# Patient Record
Sex: Male | Born: 1970 | Race: Black or African American | Hispanic: No | Marital: Single | Smoking: Current every day smoker
Health system: Southern US, Community
[De-identification: ages and names within clinical notes are randomized; demographics above are authoritative.]

---

## 2005-05-21 ENCOUNTER — Emergency Department (HOSPITAL_COMMUNITY): Admission: EM | Admit: 2005-05-21 | Discharge: 2005-05-22 | Payer: Self-pay | Admitting: Emergency Medicine

## 2014-05-06 ENCOUNTER — Ambulatory Visit (INDEPENDENT_AMBULATORY_CARE_PROVIDER_SITE_OTHER): Payer: Self-pay

## 2014-05-06 ENCOUNTER — Ambulatory Visit (INDEPENDENT_AMBULATORY_CARE_PROVIDER_SITE_OTHER): Payer: Self-pay | Admitting: Family Medicine

## 2014-05-06 VITALS — BP 102/68 | HR 71 | Temp 97.9°F | Resp 16 | Ht 71.5 in | Wt 168.0 lb

## 2014-05-06 DIAGNOSIS — R0789 Other chest pain: Secondary | ICD-10-CM

## 2014-05-06 DIAGNOSIS — R059 Cough, unspecified: Secondary | ICD-10-CM

## 2014-05-06 DIAGNOSIS — Z720 Tobacco use: Secondary | ICD-10-CM

## 2014-05-06 DIAGNOSIS — R05 Cough: Secondary | ICD-10-CM

## 2014-05-06 MED ORDER — AZITHROMYCIN 250 MG PO TABS: ORAL_TABLET | ORAL | Status: AC

## 2014-05-06 NOTE — Patient Instructions (Signed)
Use the azithromycin (antibiotic) as directed for possible chest infection/ pneumonia Rest and drink plenty of fluids, use ibuprofen as needed If your symptoms do not resolve please come back and see me - see me sooner if you are getting worse!  Please do work on quitting smoking- quitting now will reduce your risk of lung cancer, COPD and many other problems

## 2014-05-06 NOTE — Progress Notes (Signed)
[facility] [address] [place] [address] [phone]  Date:  05/06/2014   Name:  Gregory Mccullough   DOB:  [birth year]   MRN:  [id number]  PCP:  No primary care provider on file.    Chief Complaint: Night Sweats; Fatigue; and Shortness of Breath   History of Present Illness:  Gregory Mccullough is a 44 y.o. very pleasant male patient who presents with the following:  He has noted SOB, "waking up in a sweat" in the am for the last several days.  He has felt SOB for years which he attributes to smoking, but it is worse over the last week or so. He will cough up "a lot of phlegm" in the am.  The phlegm is brown in color.   He has not noted a fever but has not checked with a thermometer.  He is otherwise generally healthy He smokes about 1ppd- has done so for about 25 years.  He is interested in quitting and plans to try and quit soon His weight is stable. He is able to eat ok, but over the last few days he has noted decreased appetite. He is taking dayquil and nyquil No history of heart problems He has not noted any exertional CP.  He will have the pain in his chest when he takes a deep breath or coughs  There are no active problems to display for this patient.   History reviewed. No pertinent past medical history.  History reviewed. No pertinent past surgical history.  History  Substance Use Topics  . Smoking status: Current Every Day Smoker -- 1.00 packs/day    Types: Cigarettes  . Smokeless tobacco: Not on file  . Alcohol Use: Not on file    History reviewed. No pertinent family history.  No Known Allergies  Medication list has been reviewed and updated.  No current outpatient prescriptions on file prior to visit.   No current facility-administered medications on file prior to visit.    Review of Systems:  As per HPI- otherwise negative.   Physical Examination: Filed Vitals:   05/06/14 1249  BP: 102/68  Pulse: 71  Temp: 97.9 F (36.6  C)  Resp: 16   Filed Vitals:   05/06/14 1249  Height: 5' 11.5" (1.816 m)  Weight: 168 lb (76.204 kg)   Body mass index is 23.11 kg/(m^2). Ideal Body Weight: Weight in (lb) to have BMI = 25: 181.4  GEN: WDWN, NAD, Non-toxic, A & O x 3, looks well HEENT: Atraumatic, Normocephalic. Neck supple. No masses, No LAD.  Bilateral TM wnl, oropharynx normal.  PEERL,EOMI.  Ears and Nose: No external deformity. CV: RRR, No M/G/R. No JVD. No thrill. No extra heart sounds. PULM: CTA B, no wheezes, crackles, rhonchi. No retractions. No resp. distress. No accessory muscle use. Able to reproduce right sided CP by pressing on his right chest wall.  ABD: S, NT, ND EXTR: No c/c/e NEURO Normal gait.  PSYCH: Normally interactive. Conversant. Not depressed or anxious appearing.  Calm demeanor.   [facility] reading (PRIMARY) by  Dr. Patsy Lager. CXR:  Possible mild right middle lobe infiltrate.    Assessment and Plan: Cough - Plan: DG Chest 2 View, azithromycin (ZITHROMAX) 250 MG tablet  Other chest pain - Plan: DG Chest 2 View, azithromycin (ZITHROMAX) 250 MG tablet  Here today with cough and questionable CXR.  Explained that night sweats can indicate something serious such as HIV or cancer, offered further evaluation/ labs today.  He  does not wish to look further today due to financial concerns but agreed to get in touch with me if he is not better soon.     Signed Abbe AmsterdamJessica Tracen Mahler, MD

## 2015-08-09 IMAGING — CR DG CHEST 2V
2 series · 2 of 2 positions shown · non-contrast
Comparison: None.

CLINICAL DATA: Cough.  Chest pain.  Night sweats.

EXAM:
CHEST  2 VIEW

[PA]
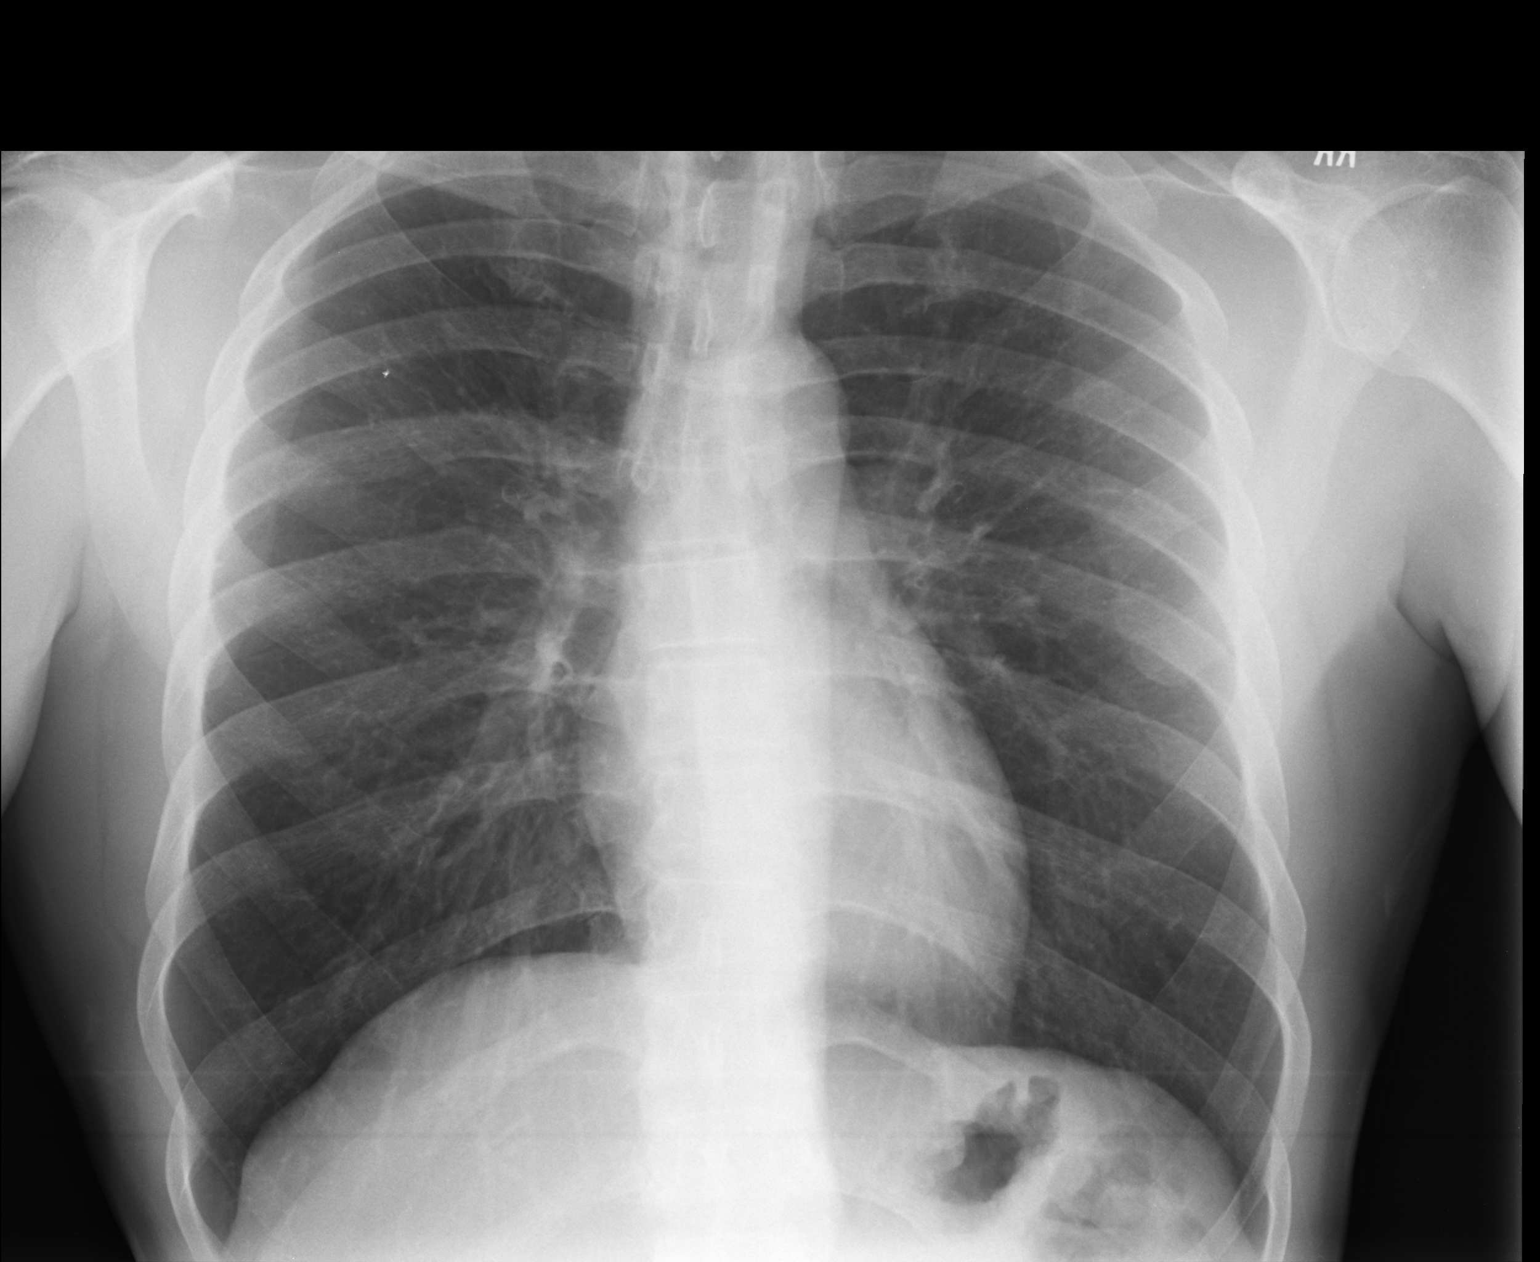

[lateral]
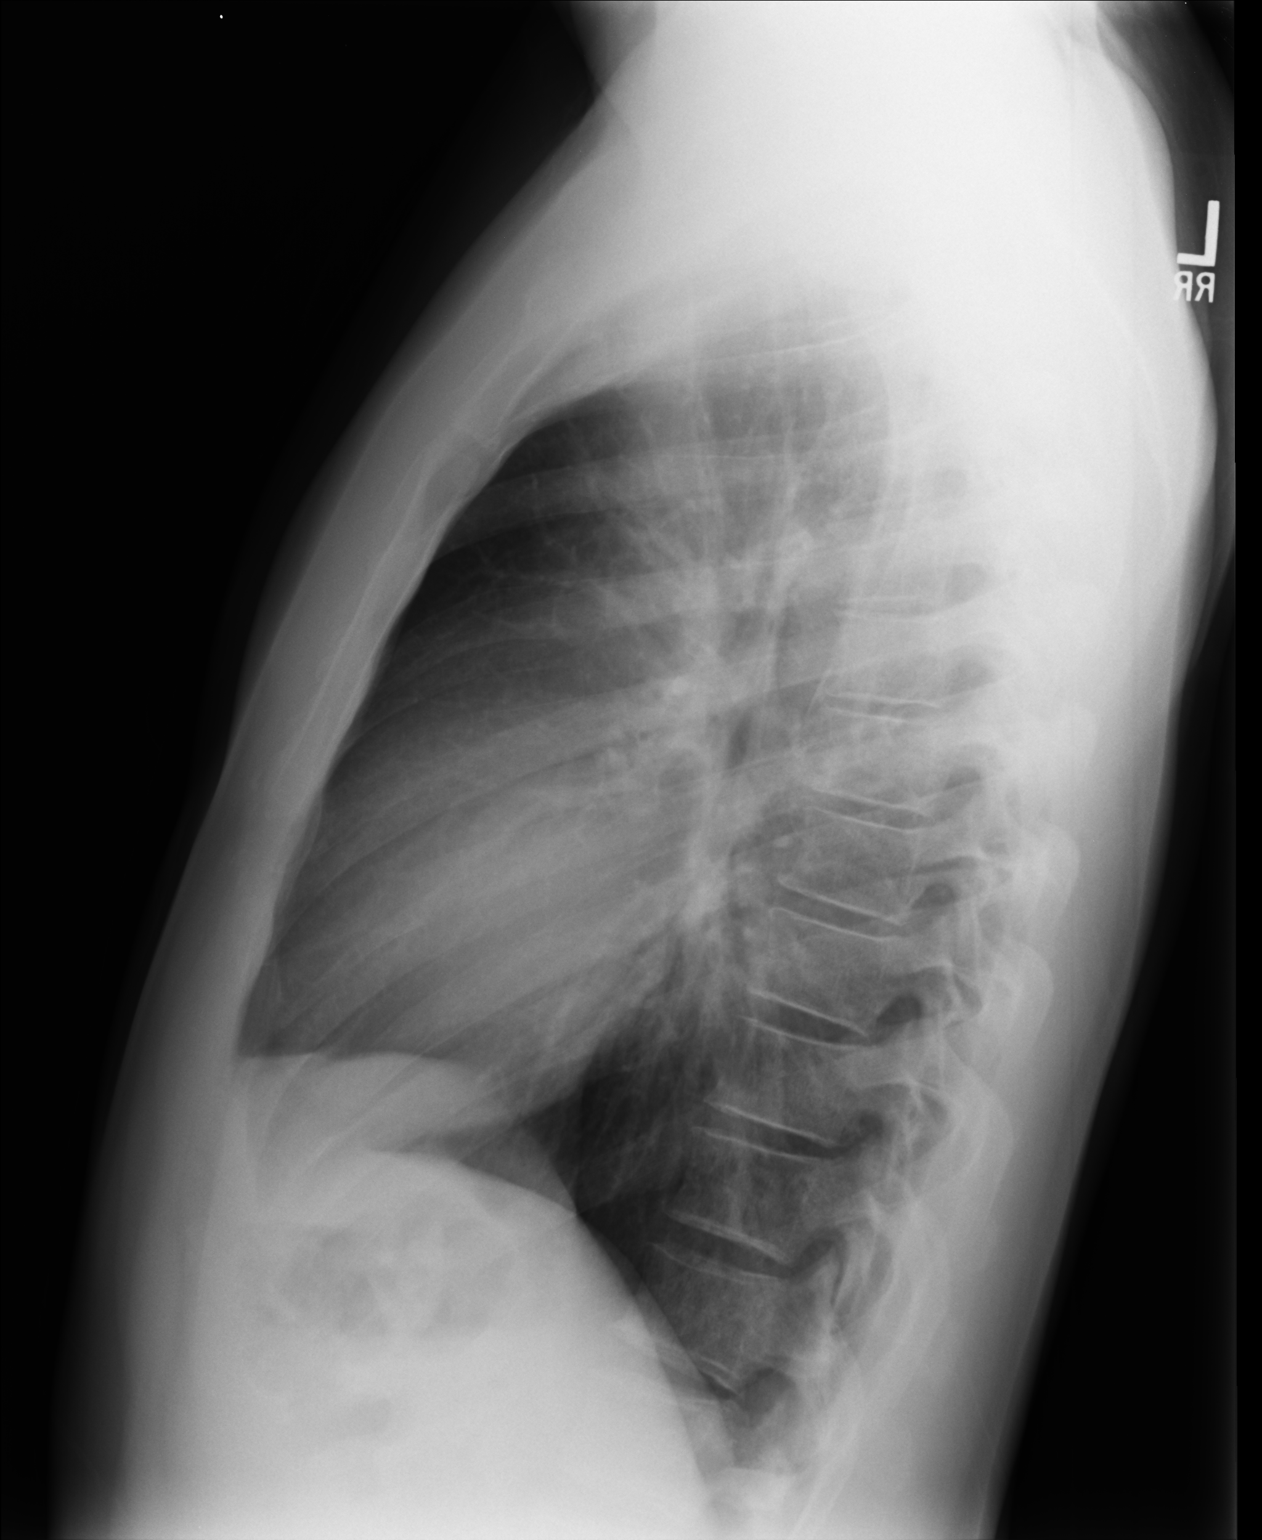

[2 of 2 positions shown; findings below may reference images not displayed]

FINDINGS: Midline trachea.  Normal heart size and mediastinal contours.

Sharp costophrenic angles.  No pneumothorax.  Clear lungs.
IMPRESSION: No active cardiopulmonary disease.

## 2020-10-20 ENCOUNTER — Other Ambulatory Visit (HOSPITAL_COMMUNITY): Payer: Self-pay

## 2020-10-20 MED ORDER — DOXYCYCLINE MONOHYDRATE 100 MG PO CAPS
ORAL_CAPSULE | ORAL | 0 refills | Status: AC
  Filled 2020-10-20: qty 3, 1d supply, fill #0
  Filled 2020-10-20: qty 11, 6d supply, fill #0

## 2020-10-20 MED ORDER — CEFPODOXIME PROXETIL 200 MG PO TABS
ORAL_TABLET | ORAL | 0 refills | Status: AC
  Filled 2020-10-20: qty 6, 3d supply, fill #0
